# Patient Record
Sex: Male | Born: 1985
Health system: Southern US, Community
[De-identification: ages and names within clinical notes are randomized; demographics above are authoritative.]

---

## 2011-08-21 ENCOUNTER — Emergency Department (INDEPENDENT_AMBULATORY_CARE_PROVIDER_SITE_OTHER)
Admission: EM | Admit: 2011-08-21 | Discharge: 2011-08-21 | Disposition: A | Discharge status: Home / Self Care | Payer: Self-pay | Source: Home / Self Care | Attending: Emergency Medicine | Admitting: Emergency Medicine

## 2011-08-21 ENCOUNTER — Encounter (HOSPITAL_COMMUNITY): Payer: Self-pay | Admitting: Emergency Medicine

## 2011-08-21 DIAGNOSIS — J020 Streptococcal pharyngitis: Secondary | ICD-10-CM

## 2011-08-21 MED ORDER — AZITHROMYCIN 250 MG PO TABS: 250.0000 mg | ORAL_TABLET | Freq: Every day | ORAL | Status: DC

## 2011-08-21 MED ORDER — ACETAMINOPHEN-CODEINE #3 300-30 MG PO TABS: 1.0000 | ORAL_TABLET | Freq: Four times a day (QID) | ORAL | Status: DC | PRN

## 2011-08-21 MED ORDER — AZITHROMYCIN 250 MG PO TABS: 250.0000 mg | ORAL_TABLET | Freq: Every day | ORAL | Status: AC

## 2011-08-21 MED ORDER — ACETAMINOPHEN-CODEINE #3 300-30 MG PO TABS: 1.0000 | ORAL_TABLET | Freq: Four times a day (QID) | ORAL | Status: AC | PRN

## 2011-08-21 NOTE — ED Provider Notes (Signed)
History     CSN: [id number]  Arrival date & time 08/21/11  0806   First MD Initiated Contact with Patient 08/21/11 [phone]      Chief Complaint  Patient presents with  . Sore Throat    (Consider location/radiation/quality/duration/timing/severity/associated sxs/prior treatment) HPI Comments: Patient presents urgent care today complaining of moderate to severe sore throat, exacerbated by swallowing and some discomfort home both areas of his neck. Uncertain if he has had any fevers but has had some body aches and chills. Patient denies any cough or upper congestion has been also drinking teas taken some over-the-counter medicines for symptoms. No improvement.  Patient denies any shortness of breath, wheezing, and is able to swallow liquids and solids.  Patient is a 26 y.o. male presenting with pharyngitis. The history is provided by the patient.  Sore Throat This is a new problem. The current episode started 2 days ago. The problem occurs constantly. The problem has been gradually worsening. Pertinent negatives include no chest pain, no abdominal pain and no shortness of breath. The symptoms are aggravated by swallowing. The symptoms are relieved by NSAIDs. He has tried acetaminophen for the symptoms. The treatment provided no relief.    History reviewed. No pertinent past medical history.  History reviewed. No pertinent past surgical history.  No family history on file.  History  Substance Use Topics  . Smoking status: Current Everyday Smoker  . Smokeless tobacco: Not on file  . Alcohol Use: Yes      Review of Systems  Constitutional: Positive for chills and appetite change. Negative for diaphoresis and unexpected weight change.  HENT: Positive for sore throat and neck pain. Negative for congestion, rhinorrhea, mouth sores, trouble swallowing, neck stiffness, dental problem, voice change and sinus pressure.   Respiratory: Negative for cough, shortness of breath and wheezing.     Cardiovascular: Negative for chest pain.  Gastrointestinal: Negative for abdominal pain.  Skin: Negative for color change, rash and wound.    Allergies  Penicillins  Home Medications   Current Outpatient Rx  Name Route Sig Dispense Refill  . ACETAMINOPHEN-CODEINE #3 300-30 MG PO TABS Oral Take 1-2 tablets by mouth every 6 (six) hours as needed for pain. 15 tablet 0  . AZITHROMYCIN 250 MG PO TABS Oral Take 1 tablet (250 mg total) by mouth daily. Take first 2 tablets together, then 1 every day until finished. 6 tablet 0    BP 113/78  Pulse 79  Temp(Src) 99 F (37.2 C) (Oral)  Resp 18  SpO2 99%  Physical Exam  Nursing note and vitals reviewed. Constitutional: He appears well-developed and well-nourished. No distress.  HENT:  Head: Normocephalic.  Right Ear: Tympanic membrane normal.  Left Ear: Tympanic membrane normal.  Mouth/Throat: Uvula is midline. Posterior oropharyngeal erythema present. No oropharyngeal exudate.  Eyes: Conjunctivae are normal. Right eye exhibits no discharge. Left eye exhibits no discharge.  Neck: Neck supple. No JVD present.  Pulmonary/Chest: Effort normal. No respiratory distress. He has decreased breath sounds. He has no wheezes.  Abdominal: Soft.  Lymphadenopathy:    He has no cervical adenopathy.  Skin: No erythema.    ED Course  Procedures (including critical care time)  Labs Reviewed  POCT RAPID STREP A ([facility] ONLY) - Abnormal; Notable for the following:    Streptococcus, Group A Screen (Direct) POSITIVE (*)    All other components within normal limits   No results found.   1. Strep pharyngitis       MDM  Streptococcal pharyngitis, uncomplicated.        Jimmie Molly, MD 08/21/11 [phone]

## 2011-08-21 NOTE — ED Notes (Signed)
Pt here with sore throat,pain with swallowing and bilat neck discomfort x 2 dys.denies fever,n,v,d.temp 99.0.pt has been drinking teas and pain meds

## 2011-08-21 NOTE — Discharge Instructions (Signed)
Pharyngitis, Viral and Bacterial Pharyngitis is soreness (inflammation) or infection of the pharynx. It is also called a sore throat. CAUSES  Most sore throats are caused by viruses and are part of a cold. However, some sore throats are caused by strep and other bacteria. Sore throats can also be caused by post nasal drip from draining sinuses, allergies and sometimes from sleeping with an open mouth. Infectious sore throats can be spread from person to person by coughing, sneezing and sharing cups or eating utensils. TREATMENT  Sore throats that are viral usually last 3-4 days. Viral illness will get better without medications (antibiotics). Strep throat and other bacterial infections will usually begin to get better about 24-48 hours after you begin to take antibiotics. HOME CARE INSTRUCTIONS   If the caregiver feels there is a bacterial infection or if there is a positive strep test, they will prescribe an antibiotic. The full course of antibiotics must be taken. If the full course of antibiotic is not taken, you or your child may become ill again. If you or your child has strep throat and do not finish all of the medication, serious heart or kidney diseases may develop.   Drink enough water and fluids to keep your urine clear or pale yellow.   Only take over-the-counter or prescription medicines for pain, discomfort or fever as directed by your caregiver.   Get lots of rest.   Gargle with salt water ( tsp. of salt in a glass of water) as often as every 1-2 hours as you need for comfort.   Hard candies may soothe the throat if individual is not at risk for choking. Throat sprays or lozenges may also be used.  SEEK MEDICAL CARE IF:   Large, tender lumps in the neck develop.   A rash develops.   Green, yellow-brown or bloody sputum is coughed up.   Your baby is older than 3 months with a rectal temperature of 100.5 F (38.1 C) or higher for more than 1 day.  SEEK IMMEDIATE MEDICAL CARE  IF:   A stiff neck develops.   You or your child are drooling or unable to swallow liquids.   You or your child are vomiting, unable to keep medications or liquids down.   You or your child has severe pain, unrelieved with recommended medications.   You or your child are having difficulty breathing (not due to stuffy nose).   You or your child are unable to fully open your mouth.   You or your child develop redness, swelling, or severe pain anywhere on the neck.   You have a fever.   Your baby is older than 3 months with a rectal temperature of 102 F (38.9 C) or higher.   Your baby is 3 months old or younger with a rectal temperature of 100.4 F (38 C) or higher.  MAKE SURE YOU:   Understand these instructions.   Will watch your condition.   Will get help right away if you are not doing well or get worse.  Document Released: 04/15/2005 Document Revised: 04/04/2011 Document Reviewed: 07/13/2007 Exit[employer] Patient Information 2012 [employer].Strep Throat Strep throat is an infection of the throat caused by a bacteria named Streptococcus pyogenes. Your caregiver may call the infection streptococcal "tonsillitis" or "pharyngitis" depending on whether there are signs of inflammation in the tonsils or back of the throat. Strep throat is most common in children from 5 to 15 years old during the cold months of the year, but   it can occur in people of any age during any season. This infection is spread from person to person (contagious) through coughing, sneezing, or other close contact. SYMPTOMS   Fever or chills.   Painful, swollen, red tonsils or throat.   Pain or difficulty when swallowing.   White or yellow spots on the tonsils or throat.   Swollen, tender lymph nodes or "glands" of the neck or under the jaw.   Red rash all over the body (rare).  DIAGNOSIS  Many different infections can cause the same symptoms. A test must be done to confirm the diagnosis so the right  treatment can be given. A "rapid strep test" can help your caregiver make the diagnosis in a few minutes. If this test is not available, a light swab of the infected area can be used for a throat culture test. If a throat culture test is done, results are usually available in a day or two. TREATMENT  Strep throat is treated with antibiotic medicine. HOME CARE INSTRUCTIONS   Gargle with 1 tsp of salt in 1 cup of warm water, 3 to 4 times per day or as needed for comfort.   Family members who also have a sore throat or fever should be tested for strep throat and treated with antibiotics if they have the strep infection.   Make sure everyone in your household washes their hands well.   Do not share food, drinking cups, or personal items that could cause the infection to spread to others.   You may need to eat a soft food diet until your sore throat gets better.   Drink enough water and fluids to keep your urine clear or pale yellow. This will help prevent dehydration.   Get plenty of rest.   Stay home from school, daycare, or work until you have been on antibiotics for 24 hours.   Only take over-the-counter or prescription medicines for pain, discomfort, or fever as directed by your caregiver.   If antibiotics are prescribed, take them as directed. Finish them even if you start to feel better.  SEEK MEDICAL CARE IF:   The glands in your neck continue to enlarge.   You develop a rash, cough, or earache.   You cough up green, yellow-brown, or bloody sputum.   You have pain or discomfort not controlled by medicines.   Your problems seem to be getting worse rather than better.  SEEK IMMEDIATE MEDICAL CARE IF:   You develop any new symptoms such as vomiting, severe headache, stiff or painful neck, chest pain, shortness of breath, or trouble swallowing.   You develop severe throat pain, drooling, or changes in your voice.   You develop swelling of the neck, or the skin on the neck  becomes red and tender.   You have a fever.   You develop signs of dehydration, such as fatigue, dry mouth, and decreased urination.   You become increasingly sleepy, or you cannot wake up completely.  Document Released: 04/12/2000 Document Revised: 04/04/2011 Document Reviewed: 06/14/2010 [employer] Patient Information 2012 [employer]. 

## 2013-03-31 ENCOUNTER — Emergency Department (INDEPENDENT_AMBULATORY_CARE_PROVIDER_SITE_OTHER)
Admission: EM | Admit: 2013-03-31 | Discharge: 2013-03-31 | Disposition: A | Discharge status: Home / Self Care | Payer: Self-pay | Source: Home / Self Care | Attending: Family Medicine | Admitting: Family Medicine

## 2013-03-31 ENCOUNTER — Encounter (HOSPITAL_COMMUNITY): Payer: Self-pay | Admitting: Emergency Medicine

## 2013-03-31 DIAGNOSIS — M6740 Ganglion, unspecified site: Secondary | ICD-10-CM

## 2013-03-31 DIAGNOSIS — M67431 Ganglion, right wrist: Secondary | ICD-10-CM

## 2013-03-31 MED ORDER — METHYLPREDNISOLONE ACETATE 40 MG/ML IJ SUSP
INTRAMUSCULAR | Status: AC
  Filled 2013-03-31: qty 5

## 2013-03-31 MED ORDER — METHYLPREDNISOLONE ACETATE 40 MG/ML IJ SUSP
40.0000 mg | Freq: Once | INTRAMUSCULAR | Status: AC
  Administered 2013-03-31: 40 mg via INTRA_ARTICULAR

## 2013-03-31 NOTE — ED Provider Notes (Signed)
CSN: [id number]     Arrival date & time 03/31/13  0930 History   First MD Initiated Contact with Patient 03/31/13 [phone]     Chief Complaint  Patient presents with  . Wrist Pain    HPI 27 yo male who presents for evaluation of right wrist pain. Noticed a small knot on top of wrist one and half months ago. He did not have any associated pain at the time. Yesterday, started having more discomfort around the wrist area, associated with some swelling. He works in [employer], [employer], agricultural. Pain is worst with movement and better with rest. He has not used any ice or medicine.   History reviewed. No pertinent past medical history. History reviewed. No pertinent past surgical history. No family history on file. History  Substance Use Topics  . Smoking status: Current Every Day Smoker  . Smokeless tobacco: Not on file  . Alcohol Use: Yes    Review of Systems Negative except per HPI Allergies  Penicillins  Home Medications  No current outpatient prescriptions on file. BP 130/71  Pulse 94  Temp(Src) 97.5 F (36.4 C) (Oral)  Resp 20  SpO2 96% Physical Exam General: no acute distress, well appearing Wrist: ganglion cyst on dorsal aspect of wrist, tenderness associated with it. Negative Fienkelstein's. Normal range of motion. Some discomfort with resisted ulnar deviation and resisted flexion.   ED Course  Procedures (including critical care time) Procedure: ganglion cyst drainage and steroid injection Consent obtained. Risks and benefits were explained. More notably risk of discoloration of skin from steroid injection was explained. Patient expressed understanding and agreed with injection.  Time Out taken Medication: 8mg  depomedrol (0.2cc of depomedrol 40mg /58ml), lidocaine 1% without epi Preparation: area cleansed with betadine 2cc of lidocaine 1% injected on superficial aspect of the ganglion cyst.  18gauge needle inserted to cyst  at 45 degree angle without any aspirated fluid. Cyst decompressed with needle insertion.  0.86mls of depomedrol 40mg /ml were diluted with 0.5cc of lidocaine This was injected deep into the ganglion, avoiding injection in superficial skin.  Patient tolerated well without bleeding or paresthesias  Patient had good range of motion of joint after injection   MDM   1. Ganglion cyst of wrist, right    Ganglion cyst: gave patient option between observation, oral steroids, ganglion aspiration and steroid injection and referral to surgery. He opted for aspiration. Risks of hypopigmentation from steroid were explained and he agreed to procedure.  Ganglion was decompressed and reinjected with steroid. Explained to patient that there is a 50% chance for it to return, at which point he can follow up with hand surgery for evaluation for surgical removal.   Marena Chancy, PGY-3 Family Medicine Resident     Lonia Skinner, MD 03/31/13 1233

## 2013-03-31 NOTE — ED Notes (Signed)
Pt  Reports  He  Has      A  Cyst  In   r  Wrist   That    He  Has  Had   dor  About  6   Weeks   Worse  Over the  Last  Several  Days  denys  Any injury

## 2013-04-01 NOTE — ED Provider Notes (Signed)
Medical screening examination/treatment/procedure(s) were performed by a resident physician or non-physician practitioner and as the supervising physician I was immediately available for consultation/collaboration.  Allysen Lazo, MD    Jacquline Terrill S Malvern Kadlec, MD 04/01/13 0837 

## 2013-08-29 ENCOUNTER — Emergency Department (HOSPITAL_COMMUNITY): Payer: No Typology Code available for payment source

## 2013-08-29 ENCOUNTER — Encounter (HOSPITAL_COMMUNITY): Payer: Self-pay | Admitting: Emergency Medicine

## 2013-08-29 ENCOUNTER — Emergency Department (HOSPITAL_COMMUNITY)
Admission: EM | Admit: 2013-08-29 | Discharge: 2013-08-29 | Disposition: A | Discharge status: Home / Self Care | Payer: No Typology Code available for payment source | Attending: Emergency Medicine | Admitting: Emergency Medicine

## 2013-08-29 DIAGNOSIS — Z23 Encounter for immunization: Secondary | ICD-10-CM | POA: Insufficient documentation

## 2013-08-29 DIAGNOSIS — IMO0002 Reserved for concepts with insufficient information to code with codable children: Secondary | ICD-10-CM | POA: Insufficient documentation

## 2013-08-29 DIAGNOSIS — S5010XA Contusion of unspecified forearm, initial encounter: Secondary | ICD-10-CM | POA: Insufficient documentation

## 2013-08-29 DIAGNOSIS — S1093XA Contusion of unspecified part of neck, initial encounter: Secondary | ICD-10-CM

## 2013-08-29 DIAGNOSIS — S0100XA Unspecified open wound of scalp, initial encounter: Secondary | ICD-10-CM | POA: Diagnosis not present

## 2013-08-29 DIAGNOSIS — S02401A Maxillary fracture, unspecified, initial encounter for closed fracture: Secondary | ICD-10-CM

## 2013-08-29 DIAGNOSIS — S0003XA Contusion of scalp, initial encounter: Secondary | ICD-10-CM | POA: Insufficient documentation

## 2013-08-29 DIAGNOSIS — S01409A Unspecified open wound of unspecified cheek and temporomandibular area, initial encounter: Secondary | ICD-10-CM | POA: Diagnosis not present

## 2013-08-29 DIAGNOSIS — S0230XA Fracture of orbital floor, unspecified side, initial encounter for closed fracture: Secondary | ICD-10-CM | POA: Diagnosis not present

## 2013-08-29 DIAGNOSIS — S0180XA Unspecified open wound of other part of head, initial encounter: Secondary | ICD-10-CM | POA: Diagnosis not present

## 2013-08-29 DIAGNOSIS — S0993XA Unspecified injury of face, initial encounter: Secondary | ICD-10-CM | POA: Diagnosis present

## 2013-08-29 DIAGNOSIS — S02109A Fracture of base of skull, unspecified side, initial encounter for closed fracture: Secondary | ICD-10-CM | POA: Insufficient documentation

## 2013-08-29 DIAGNOSIS — Z880 Allergy status to penicillin: Secondary | ICD-10-CM | POA: Diagnosis not present

## 2013-08-29 DIAGNOSIS — S0083XA Contusion of other part of head, initial encounter: Secondary | ICD-10-CM | POA: Insufficient documentation

## 2013-08-29 DIAGNOSIS — T07XXXA Unspecified multiple injuries, initial encounter: Secondary | ICD-10-CM

## 2013-08-29 DIAGNOSIS — S5011XA Contusion of right forearm, initial encounter: Secondary | ICD-10-CM

## 2013-08-29 DIAGNOSIS — S0232XA Fracture of orbital floor, left side, initial encounter for closed fracture: Secondary | ICD-10-CM

## 2013-08-29 MED ORDER — AZITHROMYCIN 250 MG PO TABS: 250.0000 mg | ORAL_TABLET | Freq: Once | ORAL | Status: DC

## 2013-08-29 MED ORDER — OXYCODONE-ACETAMINOPHEN 5-325 MG PO TABS: 2.0000 | ORAL_TABLET | ORAL | Status: DC | PRN

## 2013-08-29 MED ORDER — ONDANSETRON 8 MG PO TBDP: 8.0000 mg | ORAL_TABLET | Freq: Three times a day (TID) | ORAL | Status: DC | PRN

## 2013-08-29 MED ORDER — TETANUS-DIPHTH-ACELL PERTUSSIS 5-2.5-18.5 LF-MCG/0.5 IM SUSP
0.5000 mL | Freq: Once | INTRAMUSCULAR | Status: AC
  Administered 2013-08-29: 0.5 mL via INTRAMUSCULAR
  Filled 2013-08-29: qty 0.5

## 2013-08-29 MED ORDER — OXYCODONE-ACETAMINOPHEN 5-325 MG PO TABS
2.0000 | ORAL_TABLET | Freq: Once | ORAL | Status: AC
  Administered 2013-08-29: 2 via ORAL
  Filled 2013-08-29: qty 2

## 2013-08-29 NOTE — ED Notes (Signed)
Face, head, neck, hands, arms cleansed.  Bleeding from most areas ceased.  Left cheek swollen, left eye already shows ecchymosis/swelling.  [employer] into room to speak to pt.
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
## 2013-09-02 ENCOUNTER — Emergency Department (HOSPITAL_COMMUNITY)
Admission: EM | Admit: 2013-09-02 | Discharge: 2013-09-02 | Disposition: A | Discharge status: Home / Self Care | Payer: No Typology Code available for payment source | Attending: Emergency Medicine | Admitting: Emergency Medicine

## 2013-09-02 ENCOUNTER — Encounter (HOSPITAL_COMMUNITY): Payer: Self-pay | Admitting: Emergency Medicine

## 2013-09-02 DIAGNOSIS — Z4802 Encounter for removal of sutures: Secondary | ICD-10-CM | POA: Insufficient documentation

## 2013-09-02 DIAGNOSIS — F17200 Nicotine dependence, unspecified, uncomplicated: Secondary | ICD-10-CM | POA: Diagnosis not present

## 2013-09-02 DIAGNOSIS — Z880 Allergy status to penicillin: Secondary | ICD-10-CM | POA: Diagnosis not present
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
## 2013-09-02 NOTE — Discharge Instructions (Signed)
Suture Removal, Care After Refer to this sheet in the next few weeks. These instructions provide you with information on caring for yourself after your procedure. Your health care provider may also give you more specific instructions. Your treatment has been planned according to current medical practices, but problems sometimes occur. Call your health care provider if you have any problems or questions after your procedure. WHAT TO EXPECT AFTER THE PROCEDURE After your stitches (sutures) are removed, it is typical to have the following:  Some discomfort and swelling in the wound area.  Slight redness in the area. HOME CARE INSTRUCTIONS   If you have skin adhesive strips over the wound area, do not take the strips off. They will fall off on their own in a few days. If the strips remain in place after 14 days, you may remove them.  Change any bandages (dressings) at least once a day or as directed by your health care provider. If the bandage sticks, soak it off with warm, soapy water.  Apply cream or ointment only as directed by your health care provider. If using cream or ointment, wash the area with soap and water 2 times a day to remove all the cream or ointment. Rinse off the soap and pat the area dry with a clean towel.  Keep the wound area dry and clean. If the bandage becomes wet or dirty, or if it develops a bad smell, change it as soon as possible.  Continue to protect the wound from injury.  Use sunscreen when out in the sun. New scars become sunburned easily. SEEK MEDICAL CARE IF:  You have increasing redness, swelling, or pain in the wound.  You see pus coming from the wound.  You have a fever.  You notice a bad smell coming from the wound or dressing.  Your wound breaks open (edges not staying together). Document Released: 01/08/2001 Document Revised: 02/03/2013 Document Reviewed: 11/25/2012 Exit[employer] Patient Information 2014 [employer].  

## 2013-09-02 NOTE — ED Provider Notes (Signed)
Medical screening examination/treatment/procedure(s) were performed by non-physician practitioner and as supervising physician I was immediately available for consultation/collaboration.   EKG Interpretation None       Dyshawn Cangelosi M Isham Smitherman, MD 09/02/13 0621 

## 2013-09-08 ENCOUNTER — Emergency Department (HOSPITAL_COMMUNITY)
Admission: EM | Admit: 2013-09-08 | Discharge: 2013-09-08 | Disposition: A | Discharge status: Home / Self Care | Payer: No Typology Code available for payment source | Attending: Emergency Medicine | Admitting: Emergency Medicine

## 2013-09-08 ENCOUNTER — Encounter (HOSPITAL_COMMUNITY): Payer: Self-pay | Admitting: Emergency Medicine

## 2013-09-08 DIAGNOSIS — Z4802 Encounter for removal of sutures: Secondary | ICD-10-CM | POA: Insufficient documentation

## 2013-09-08 DIAGNOSIS — F17200 Nicotine dependence, unspecified, uncomplicated: Secondary | ICD-10-CM | POA: Insufficient documentation

## 2013-09-08 DIAGNOSIS — Z880 Allergy status to penicillin: Secondary | ICD-10-CM | POA: Diagnosis not present

## 2013-09-08 NOTE — ED Notes (Signed)
Staple removal from head

## 2013-09-08 NOTE — ED Provider Notes (Signed)
CSN: [id number]     Arrival date & time 09/08/13  0944 History   First MD Initiated Contact with Patient 09/08/13 [phone]     Chief Complaint  Patient presents with  . Suture / Staple Removal     (Consider location/radiation/quality/duration/timing/severity/associated sxs/prior Treatment) HPI Comments: Patient presents for suture removal. The laceration is located on his left scalp. The patient reports subjective healing of the laceration and denies any complications or concerns. The patient denies any pain, erythema, tenderness, drainage of the site. Denies fever, NVD, abdominal pain, numbness/tingling, skin color changes.    Patient is a 28 y.o. male presenting with suture removal. The history is provided by the patient. No language interpreter was used.  Suture / Staple Removal This is a new problem. The current episode started 1 to 4 weeks ago. The problem occurs constantly. The problem has been unchanged. Pertinent negatives include no abdominal pain, arthralgias, chest pain, chills, fatigue, fever, nausea, neck pain, vomiting or weakness. Nothing aggravates the symptoms. He has tried nothing for the symptoms. The treatment provided significant relief.    History reviewed. No pertinent past medical history. History reviewed. No pertinent past surgical history. History reviewed. No pertinent family history. History  Substance Use Topics  . Smoking status: Current Every Day Smoker  . Smokeless tobacco: Not on file  . Alcohol Use: Yes    Review of Systems  Constitutional: Negative for fever, chills and fatigue.  HENT: Negative for trouble swallowing.   Eyes: Negative for visual disturbance.  Respiratory: Negative for shortness of breath.   Cardiovascular: Negative for chest pain and palpitations.  Gastrointestinal: Negative for nausea, vomiting, abdominal pain and diarrhea.  Genitourinary: Negative for dysuria and difficulty urinating.  Musculoskeletal: Negative for arthralgias and  neck pain.  Skin: Positive for wound. Negative for color change.  Neurological: Negative for dizziness and weakness.  Psychiatric/Behavioral: Negative for dysphoric mood.      Allergies  Penicillins  Home Medications   Prior to Admission medications   Medication Sig Start Date End Date Taking? Authorizing Provider  Multiple Vitamin (MULTIVITAMIN WITH MINERALS) TABS tablet Take 1 tablet by mouth daily.   Yes Historical Provider, MD  oxyCODONE-acetaminophen (PERCOCET/ROXICET) 5-325 MG per tablet Take 2 tablets by mouth every 4 (four) hours as needed for severe pain. 08/29/13  Yes Olivia Mackielga M Otter, MD  azithromycin (ZITHROMAX Z-PAK) 250 MG tablet Take 1 tablet (250 mg total) by mouth once. Take two tablets today, then one tablet a day for the following 4 days 08/29/13   Olivia Mackielga M Otter, MD  ondansetron (ZOFRAN ODT) 8 MG disintegrating tablet Take 1 tablet (8 mg total) by mouth every 8 (eight) hours as needed for nausea or vomiting. 08/29/13   Olivia Mackielga M Otter, MD   BP 106/67  Pulse 71  Temp(Src) 97.6 F (36.4 C) (Oral)  Resp 16  Ht 5\' 11"  (1.803 m)  Wt 140 lb (63.504 kg)  BMI 19.53 kg/m2  SpO2 100% Physical Exam  Nursing note and vitals reviewed. Constitutional: He is oriented to person, place, and time. He appears well-developed and well-nourished. No distress.  HENT:  Head: Normocephalic and atraumatic.  Left temporal scalp laceration with 3 staples intact and no signs of infection.   Eyes: Conjunctivae are normal.  Neck: Normal range of motion.  Cardiovascular: Normal rate and regular rhythm.  Exam reveals no gallop and no friction rub.   No murmur heard. Pulmonary/Chest: Effort normal and breath sounds normal. He has no wheezes. He has no rales. He  exhibits no tenderness.  Musculoskeletal: Normal range of motion.  Neurological: He is alert and oriented to person, place, and time. Coordination normal.  Speech is goal-oriented. Moves limbs without ataxia.   Skin: Skin is warm and dry.   Psychiatric: He has a normal mood and affect. His behavior is normal.    ED Course  Procedures (including critical care time)  SUTURE REMOVAL Performed by: Emilia BeckKaitlyn Navid Lenzen  Consent: Verbal consent obtained. Consent given by: patient Required items: required blood products, implants, devices, and special equipment available Time out: Immediately prior to procedure a "time out" was called to verify the correct patient, procedure, equipment, support staff and site/side marked as required.  Location: left temporal scalp  Wound Appearance: clean  Sutures/Staples Removed: 3  Patient tolerance: Patient tolerated the procedure well with no immediate complications.     Labs Review Labs Reviewed - No data to display  Imaging Review No results found.   EKG Interpretation None      MDM   Final diagnoses:  Encounter for staple removal    11:03 AM Staples removed without difficulty. No signs of infection.    Emilia BeckKaitlyn Jirah Rider, PA-C 09/08/13 [address]1106  Elanda Garmany, [place] 09/08/13 1107

## 2013-09-14 NOTE — ED Provider Notes (Signed)
Medical screening examination/treatment/procedure(s) were performed by non-physician practitioner and as supervising physician I was immediately available for consultation/collaboration.   EKG Interpretation None        Kamareon Sciandra, MD 09/14/13 1742 
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
## 2017-05-16 ENCOUNTER — Encounter (HOSPITAL_COMMUNITY): Payer: Self-pay | Admitting: Emergency Medicine

## 2017-05-16 ENCOUNTER — Emergency Department (HOSPITAL_COMMUNITY): Payer: Self-pay

## 2017-05-16 ENCOUNTER — Emergency Department (HOSPITAL_COMMUNITY)
Admission: EM | Admit: 2017-05-16 | Discharge: 2017-05-16 | Disposition: A | Discharge status: Home / Self Care | Payer: Self-pay | Attending: Emergency Medicine | Admitting: Emergency Medicine

## 2017-05-16 DIAGNOSIS — J069 Acute upper respiratory infection, unspecified: Secondary | ICD-10-CM

## 2017-05-16 DIAGNOSIS — J189 Pneumonia, unspecified organism: Secondary | ICD-10-CM

## 2017-05-16 DIAGNOSIS — F17200 Nicotine dependence, unspecified, uncomplicated: Secondary | ICD-10-CM | POA: Insufficient documentation

## 2017-05-16 DIAGNOSIS — Z720 Tobacco use: Secondary | ICD-10-CM

## 2017-05-16 DIAGNOSIS — J1100 Influenza due to unidentified influenza virus with unspecified type of pneumonia: Secondary | ICD-10-CM | POA: Insufficient documentation

## 2017-05-16 DIAGNOSIS — J029 Acute pharyngitis, unspecified: Secondary | ICD-10-CM

## 2017-05-16 DIAGNOSIS — R6889 Other general symptoms and signs: Secondary | ICD-10-CM

## 2017-05-16 MED ORDER — DOXYCYCLINE HYCLATE 100 MG PO CAPS: 100.0000 mg | ORAL_CAPSULE | Freq: Two times a day (BID) | ORAL | 0 refills | Status: DC

## 2017-05-16 NOTE — ED Notes (Signed)
Bed: WTR8 Expected date:  Expected time:  Means of arrival:  Comments: 

## 2017-05-16 NOTE — ED Notes (Signed)
Patient transported to X-ray 

## 2017-05-16 NOTE — ED Triage Notes (Signed)
Patient reports sore throat yesterday that has cleared up today but is still having nasal drainage. Pt reports fevers at home but did not take anything for that. Pt took mucinex this am.

## 2017-05-16 NOTE — ED Provider Notes (Signed)
[facility]-EMERGENCY DEPT Provider Note   CSN: [id number] Arrival date & time: 05/16/17  1034     History   Chief Complaint Chief Complaint  Patient presents with  . Sore Throat  . Nasal Congestion    HPI Michael Carey is a 32 y.o. male who presents to the ED with complaints of URI symptoms x 2-3 days.  Symptoms include chills, fever with Tmax 102.5, sore throat, cough with yellow sputum production, nasal congestion, rhinorrhea, and body aches.  He has been using Mucinex multisymptom and cough drops which have helped his symptoms, and his symptoms seem to worsen at night but have no specific known aggravating factors.  He did not receive a flu shot, no known sick contacts.  He is a smoker.  He denies any drooling, trismus, ear pain or drainage, chest pain, shortness of breath, wheezing, abdominal pain, n/v/d/c, dysuria, hematuria, arthralgias, numbness, tingling, focal weakness, rashes, or any other complaints at this time.   The history is provided by the patient and medical records. No language interpreter was used.  Sore Throat  This is a new problem. The current episode started more than 2 days ago. The problem occurs constantly. The problem has been gradually improving. Pertinent negatives include no chest pain, no abdominal pain and no shortness of breath. Nothing aggravates the symptoms. The symptoms are relieved by medications (mucinex multisymptom and cough drops). Treatments tried: mucinex multisymptom and cough drops. The treatment provided moderate relief.    History reviewed. No pertinent past medical history.  There are no active problems to display for this patient.   History reviewed. No pertinent surgical history.     Home Medications    Prior to Admission medications   Medication Sig Start Date End Date Taking? Authorizing Provider  azithromycin (ZITHROMAX Z-PAK) 250 MG tablet Take 1 tablet (250 mg total) by mouth once. Take two tablets  today, then one tablet a day for the following 4 days Patient not taking: Reported on 05/16/2017 08/29/13   Marisa Severintter, Olga, MD  ondansetron (ZOFRAN ODT) 8 MG disintegrating tablet Take 1 tablet (8 mg total) by mouth every 8 (eight) hours as needed for nausea or vomiting. Patient not taking: Reported on 05/16/2017 08/29/13   Marisa Severintter, Olga, MD  oxyCODONE-acetaminophen (PERCOCET/ROXICET) 5-325 MG per tablet Take 2 tablets by mouth every 4 (four) hours as needed for severe pain. Patient not taking: Reported on 05/16/2017 08/29/13   Marisa Severintter, Olga, MD    Family History No family history on file.  Social History Social History   Tobacco Use  . Smoking status: Current Every Day Smoker  Substance Use Topics  . Alcohol use: Yes  . Drug use: No     Allergies   Penicillins   Review of Systems Review of Systems  Constitutional: Positive for chills and fever.  HENT: Positive for congestion, rhinorrhea and sore throat. Negative for drooling, ear discharge, ear pain and trouble swallowing.   Respiratory: Positive for cough. Negative for shortness of breath and wheezing.   Cardiovascular: Negative for chest pain.  Gastrointestinal: Negative for abdominal pain, constipation, diarrhea, nausea and vomiting.  Genitourinary: Negative for dysuria and hematuria.  Musculoskeletal: Positive for myalgias. Negative for arthralgias.  Skin: Negative for rash.  Allergic/Immunologic: Negative for immunocompromised state.  Neurological: Negative for weakness and numbness.  Psychiatric/Behavioral: Negative for confusion.   All other systems reviewed and are negative for acute change except as noted in the HPI.    Physical Exam Updated Vital Signs BP 106/73 (  BP Location: Left Arm)   Pulse 72   Temp 98.9 F (37.2 C) (Oral)   Resp 14   SpO2 96%   Physical Exam  Constitutional: He is oriented to person, place, and time. Vital signs are normal. He appears well-developed and well-nourished.  Non-toxic appearance. No  distress.  Afebrile, nontoxic, NAD  HENT:  Head: Normocephalic and atraumatic.  Nose: Mucosal edema present.  Mouth/Throat: Uvula is midline, oropharynx is clear and moist and mucous membranes are normal. No trismus in the jaw. No uvula swelling. Tonsils are 0 on the right. Tonsils are 0 on the left. No tonsillar exudate.  Nose mildly congested. Oropharynx clear and moist, without uvular swelling or deviation, no trismus or drooling, no tonsillar swelling or erythema, no exudates. No PTA  Eyes: Conjunctivae and EOM are normal. Right eye exhibits no discharge. Left eye exhibits no discharge.  Neck: Normal range of motion. Neck supple.  Cardiovascular: Normal rate, regular rhythm, normal heart sounds and intact distal pulses. Exam reveals no gallop and no friction rub.  No murmur heard. Pulmonary/Chest: Effort normal. No respiratory distress. He has decreased breath sounds. He has no wheezes. He has no rhonchi. He has no rales.  Perhaps slightly diminished lung sounds (?poor inspiratory effort), but otherwise CTAB in all lung fields, no w/r/r, no hypoxia or increased WOB, speaking in full sentences, SpO2 96% on RA   Abdominal: Soft. Normal appearance and bowel sounds are normal. He exhibits no distension. There is no tenderness. There is no rigidity, no rebound, no guarding, no CVA tenderness, no tenderness at McBurney's point and negative Murphy's sign.  Musculoskeletal: Normal range of motion.  Neurological: He is alert and oriented to person, place, and time. He has normal strength. No sensory deficit.  Skin: Skin is warm, dry and intact. No rash noted.  Psychiatric: He has a normal mood and affect.  Nursing note and vitals reviewed.    ED Treatments / Results  Labs (all labs ordered are listed, but only abnormal results are displayed) Labs Reviewed - No data to display  EKG  EKG Interpretation None       Radiology Dg Chest 2 View  Result Date: 05/16/2017 CLINICAL DATA:  Cough  and fever for several days. EXAM: CHEST  2 VIEW COMPARISON:  None. FINDINGS: The cardiomediastinal silhouette is unremarkable. Patchy opacities within the right upper lobe and left lower lobe are suspicious for pneumonia. There is no evidence of pulmonary edema, suspicious pulmonary nodule/mass, pleural effusion, or pneumothorax. No acute bony abnormalities are identified. IMPRESSION: 1. Patchy right upper lobe and left lower lobe opacities suspicious for pneumonia. Radiographic follow-up to resolution recommended following appropriate therapy. Electronically Signed   By: Harmon Pier M.D.   On: 05/16/2017 13:13    Procedures Procedures (including critical care time)  Medications Ordered in ED Medications - No data to display   Initial Impression / Assessment and Plan / ED Course  I have reviewed the triage vital signs and the nursing notes.  Pertinent labs & imaging results that were available during my care of the patient were reviewed by me and considered in my medical decision making (see chart for details).     32 y.o. male here with URI symptoms x2-3 days. On exam, clear throat, mild nasal congestion, lungs without definite wheezing/rhonchi/rales but slightly diminished sounds, afebrile and nontoxic. Given that pt reports fever of 102.5 and he took an antipyretic just PTA, will get CXR to ensure no PNA. Doubt strep, doubt need for  other testing at this time. Could be flu but he's already outside of the window for tamiflu so no use in testing. Pt initially didn't want to wait for CXR but then he is agreeable; doesn't feel it necessary for any other interventions at this time, which is reasonable. Will reassess after CXR.   1:25 PM CXR showing patchy RUL and LLL opacities suspicious for PNA. Will d/c home with abx to cover for this. Advised OTC remedies for symptomatic relief, and f/up with [facility] in 1wk for recheck and to establish medical care. Smoking cessation strongly encouraged. I  explained the diagnosis and have given explicit precautions to return to the ER including for any other new or worsening symptoms. The patient understands and accepts the medical plan as it's been dictated and I have answered their questions. Discharge instructions concerning home care and prescriptions have been given. The patient is STABLE and is discharged to home in good condition.    Final Clinical Impressions(s) / ED Diagnoses   Final diagnoses:  Community acquired pneumonia, unspecified laterality  Upper respiratory tract infection, unspecified type  Sore throat  Flu-like symptoms  Tobacco user    ED Discharge Orders        Ordered    doxycycline (VIBRAMYCIN) 100 MG capsule  2 times daily     05/16/17 [address] Sabino, [place], [place] 05/16/17 1328    Lorre Nick, MD 05/17/17 [phone]

## 2017-05-16 NOTE — Discharge Instructions (Signed)
Continue to stay well-hydrated. Gargle warm salt water and spit it out. Use chloraseptic spray as needed for sore throat. Continue to alternate between Tylenol and Ibuprofen for pain or fever. Use Mucinex for cough suppression/expectoration of mucus. Use netipot and flonase to help with nasal congestion. May consider over-the-counter Benadryl or other antihistamine to decrease secretions and for help with your symptoms. STOP SMOKING! Take antibiotic as directed until completed. [facility]7 days for recheck of ongoing symptoms and to establish medical care. Return to emergency department for emergent changing or worsening of symptoms.

## 2018-02-05 ENCOUNTER — Other Ambulatory Visit: Payer: Self-pay

## 2018-02-05 ENCOUNTER — Encounter (HOSPITAL_BASED_OUTPATIENT_CLINIC_OR_DEPARTMENT_OTHER): Payer: Self-pay | Admitting: *Deleted

## 2018-02-05 ENCOUNTER — Emergency Department (HOSPITAL_BASED_OUTPATIENT_CLINIC_OR_DEPARTMENT_OTHER)
Admission: EM | Admit: 2018-02-05 | Discharge: 2018-02-05 | Disposition: A | Discharge status: Home / Self Care | Payer: Self-pay | Attending: Emergency Medicine | Admitting: Emergency Medicine

## 2018-02-05 DIAGNOSIS — K029 Dental caries, unspecified: Secondary | ICD-10-CM | POA: Insufficient documentation

## 2018-02-05 DIAGNOSIS — F17200 Nicotine dependence, unspecified, uncomplicated: Secondary | ICD-10-CM | POA: Insufficient documentation

## 2018-02-05 MED ORDER — CHLORHEXIDINE GLUCONATE 0.12 % MT SOLN: 15.0000 mL | Freq: Two times a day (BID) | OROMUCOSAL | 0 refills | Status: AC

## 2018-02-05 MED ORDER — CLINDAMYCIN HCL 150 MG PO CAPS: 450.0000 mg | ORAL_CAPSULE | Freq: Three times a day (TID) | ORAL | 0 refills | Status: AC

## 2018-02-05 MED ORDER — BENZOCAINE 10 % MT GEL: 1.0000 "application " | OROMUCOSAL | 0 refills | Status: AC | PRN

## 2018-02-05 MED FILL — ORAJEL: 10 | 7 days supply | Qty: 7 | Fill #0

## 2018-02-05 MED FILL — CHLORHEXIDINE 0.12% RINSE: 0.12 | 16 days supply | Qty: 473 | Fill #0

## 2018-02-05 MED FILL — CLINDAMYCIN HCL 150 MG CAPS: 150 | 7 days supply | Qty: 63 | Fill #0

## 2018-02-05 NOTE — ED Triage Notes (Signed)
2 weeks pt has had multiple toothaches, feels as if the pain is going "to his head now"

## 2018-02-05 NOTE — Discharge Instructions (Addendum)
Please return to the Emergency Department for any new or worsening symptoms or if your symptoms do not improve. Please be sure to follow up with your Primary Care Physician as soon as possible regarding your visit today. If you do not have a Primary Doctor please use the resources below to establish one. Please use the antibiotic medication clindamycin as prescribed.  You may also use the mouthwash Peridex as prescribed.  You may use the Orajel as prescribed.  Do not swallow the Peridex or Orajel. You must follow-up with a dentist as soon as possible for further evaluation and treatment of your dental infection.  Only dentist can fix tooth.  Please use the resources below to find a dentist.  Contact a health care provider if: Your pain is not controlled with medicines. Your symptoms are worse. You have new symptoms. Get help right away if: You are unable to open your mouth. You are having trouble breathing or swallowing. You have a fever. Your face, neck, or jaw is swollen.  Do not take your medicine if  develop an itchy rash, swelling in your mouth or lips, or difficulty breathing.   RESOURCE GUIDE  Chronic Pain Problems: Contact [facility]  [phone] Patients need to be referred by their primary care doctor.  Insufficient Money for Medicine: Contact [employer] Way:  call "211" or [facility] [phone].  No Primary Care Doctor: Call [facility]  [phone] - can help you locate a primary care doctor that  accepts your insurance, provides certain services, etc. Physician Referral Service- [phone]  Agencies that provide inexpensive medical care: [facility]  [phone] [facility]  [phone] [facility]  [phone] [facility]  [phone] [facility]  [phone] [facility]  [phone]  Medicaid-accepting [facility] Providers: [facility]- [address],  [address]  [phone], Mon-Fri 9am-7pm, Sat 9am-1pm [facility]- [address] [place], Suite [place]  [phone] [facility]- [address] [place]  [phone] [facility]- [address]  [phone] Renaye Rakers- [address] [place], [address], [phone]  Only accepts [place] patients after they have their name  applied to their card  Self Pay (no insurance) in [facility]: Sickle Cell Patients: Dr Willey Blade, [facility]  [address] [place], [phone] [facility]- [address] [place]  [phone]       [facility] [place]- [address]       -     [facility]- see information above (Speak to [employer] if you do not have insurance)       -  [facility]- [address] [place], [phone]       -  [facility]- [address],  [phone]       -  [facility]- [address], [phone]       -  Dr Julio Sicks-  [address], [place], [phone]       -  [facility]- [address] [phone]       -  [facility]- [address], [phone], also [address] [phone]       -    [facility]- [address] [place], [phone], 1st &  3rd Saturday   every month, 10am-1pm  1) Find a Doctor and Pay Out of Pocket Although you won't have to find out who is covered by your insurance plan, it is a good idea to ask around and get recommendations. You will then need to call the office and see if the doctor you have chosen will accept you as a new patient and what types of options they offer for patients who are self-pay. Some doctors offer discounts or will set up payment plans for their patients who do not have insurance, but you will need to ask so you aren't surprised when you get to your appointment.  2) Contact Your Local Health Department Not all health departments have  doctors that can see patients for sick visits, but many do, so it is worth a call to see if yours does. If you don't know where your local health department is, you can check in your phone book. The CDC also has a tool to help you locate your state's health department, and many state websites also have listings of all of their local health departments.  3) Find a [facility] If your illness is not likely to be very severe or complicated, you may want to try a walk in clinic. These are popping up all over the country in pharmacies, drugstores, and shopping centers. They're usually staffed by nurse practitioners or physician assistants that have been trained to treat common illnesses and complaints. They're usually fairly quick and inexpensive. However, if you have serious medical issues or chronic medical problems, these are probably not your best option  [facility] [place], [facility], [address] [place], phone [phone] or [phone].  Monday - Friday, call for an appointment. [place], [facility], [address], [place], phone [phone] or [phone].  Monday - Friday, call for an appointment.  Abuse/Neglect: [place] [phone] [place] [phone] (After Hours)  Emergency Shelter:  [facility] [phone]  Maternity Homes: Room at the [place] [phone] [place] [phone]  MRSA Hotline #:   (918)341-9888  [facility] Dept. [address]         [place] Phone:  [phone]                                  Phone:  [phone]                    Phone:  [place], [phone]       -     Larence Penning  [facility] in [place], [address]                                  [place] [phone] or [phone] (After Hours)   [place]  Substance Abuse Resources: Alcohol and Drug Services  [place] [phone] [facility] [phone] Residential & Outpatient Substance Abuse Program  [phone]  Psychological Services: [place]  [phone] [place]  [place], [place] [address], [place], [place]: [phone] or [phone], [employer]uy  Dental Assistance  If unable to pay or uninsured, contact:  [facility] or [facility]. to become qualified for the adult dental clinic.  Patients with Medicaid: [facility] [phone] [address], [place] [address] [phone]  If unable to pay, or uninsured, contact [facility] [phone]) or Xzaria Teo [phone] in [place], [place] in [facility]) to become qualified for the adult dental clinic   Other [place]- [place], [place], [place], [address], [place], [place], 2nd and 4th Thursday of the month at 6:30am.  10 clients each day by appointment, can sometimes see walk-in patients if someone does not show for an appointment. [facility]- [address] [place] [address], [place], [address], [place], [place], [place], [address], [place] Department- [phone] [place] [facility] [phone]

## 2018-02-05 NOTE — ED Provider Notes (Signed)
[facility] EMERGENCY DEPARTMENT Provider Note   CSN: [id number] Arrival date & time: 02/05/18  1312     History   Chief Complaint Chief Complaint  Patient presents with  . Dental Pain    HPI Michael Carey is a 32 y.o. male presenting for 1 week of upper left-sided dental pain.  Patient describes his pain as a throbbing constant moderate in intensity pain that is worsened with chewing on that side.  Patient states that his pain occasionally radiates up to his left ear.  Patient states that he has been using ibuprofen for his pain with moderate relief.  Patient denies fever, trouble swallowing, trouble speaking, facial swelling, neck swelling, tooth drainage or gum swelling.  Patient states that he is feeling well aside from his dental pain.  Patient is requesting dental resources today.  HPI  History reviewed. No pertinent past medical history.  There are no active problems to display for this patient.   History reviewed. No pertinent surgical history.      Home Medications    Prior to Admission medications   Medication Sig Start Date End Date Taking? Authorizing Provider  benzocaine (ORAJEL) 10 % mucosal gel Use as directed 1 application in the mouth or throat as needed for mouth pain. Do not swallow 02/05/18   Harlene Salts A, PA-C  chlorhexidine (PERIDEX) 0.12 % solution Use as directed 15 mLs in the mouth or throat 2 (two) times daily. Do not swallow 02/05/18   Harlene Salts A, PA-C  clindamycin (CLEOCIN) 150 MG capsule Take 3 capsules (450 mg total) by mouth 3 (three) times daily for 7 days. 02/05/18 02/12/18  Bill Salinas, PA-C    Family History History reviewed. No pertinent family history.  Social History Social History   Tobacco Use  . Smoking status: Current Every Day Smoker  . Smokeless tobacco: Never Used  Substance Use Topics  . Alcohol use: Yes  . Drug use: No     Allergies   Penicillins   Review of Systems Review of  Systems  Constitutional: Negative.  Negative for chills and fever.  HENT: Positive for dental problem and ear pain. Negative for drooling, facial swelling, rhinorrhea, sore throat, trouble swallowing and voice change.   Gastrointestinal: Negative.  Negative for nausea and vomiting.  Musculoskeletal: Negative.  Negative for neck pain and neck stiffness.   Physical Exam Updated Vital Signs BP 121/84 (BP Location: Right Arm)   Pulse 62   Temp 98.4 F (36.9 C) (Oral)   Resp 16   Ht 5\' 11"  (1.803 m)   Wt 63.5 kg   SpO2 100%   BMI 19.53 kg/m   Physical Exam  Constitutional: He appears well-developed and well-nourished. No distress.  HENT:  Head: Normocephalic and atraumatic.  Right Ear: Hearing, tympanic membrane, external ear and ear canal normal.  Left Ear: Hearing, tympanic membrane, external ear and ear canal normal.  Nose: Nose normal.  Mouth/Throat: Uvula is midline, oropharynx is clear and moist and mucous membranes are normal. No trismus in the jaw. Dental caries present. No dental abscesses or uvula swelling. No oropharyngeal exudate, posterior oropharyngeal edema, posterior oropharyngeal erythema or tonsillar abscesses.    Patient with multiple dental caries present.  The largest of which is left upper side as indicated.  There is no gingival erythema, swelling or drainage present.  No facial swelling, no tongue protrusion, no drooling.  Extraocular movements intact bilaterally without pain.  Eyes: Pupils are equal, round, and reactive to light. EOM  are normal.  Neck: Trachea normal, normal range of motion, full passive range of motion without pain and phonation normal. Neck supple. No tracheal tenderness present. No tracheal deviation present.  Pulmonary/Chest: Effort normal. No respiratory distress.  Abdominal: Soft. There is no tenderness. There is no rebound and no guarding.  Musculoskeletal: Normal range of motion.  Neurological: He is alert. GCS eye subscore is 4. GCS  verbal subscore is 5. GCS motor subscore is 6.  Speech is clear and goal oriented, follows commands Major Cranial nerves without deficit, no facial droop Moves extremities without ataxia, coordination intact Normal gait  Skin: Skin is warm and dry.  Psychiatric: He has a normal mood and affect. His behavior is normal.   ED Treatments / Results  Labs (all labs ordered are listed, but only abnormal results are displayed) Labs Reviewed - No data to display  EKG None  Radiology No results found.  Procedures Procedures (including critical care time)  Medications Ordered in ED Medications - No data to display   Initial Impression / Assessment and Plan / ED Course  I have reviewed the triage vital signs and the nursing notes.  Pertinent labs & imaging results that were available during my care of the patient were reviewed by me and considered in my medical decision making (see chart for details).    Patient with dental pain. Infected dental surface cavity noted, without signs or symptoms of dental abscess, no swelling/erythema/tenderness of the gums.  Patient is well-appearing, afebrile, nontoxic, speaking well.  Patient able to swallow without pain.  No signs of swelling or concern for Ludwig's angina/Peritonsilar abscess/Retropharyngeal abscess or other deep tissue infections.  No sign of swelling of the neck, patient has good range of motion of the neck, no trismus. No facial swelling, no signs of preseptal or orbital cellulitis. Patient with penicillin allergy, will be treated with clindamycin 450 mg 3 times daily for the next 7 days. Patient has also been given prescriptions for Peridex and Orajel.  Patient informed to follow-up with a dentist as soon as possible for further evaluation and treatment of his cavities.  Patient given dental resources.  Patient afebrile, not tachycardic, not hypotensive, well-appearing in no acute distress.  Patient informed that he may also use  ibuprofen as directed the packaging for pain.  At this time there does not appear to be any evidence of an acute emergency medical condition and the patient appears stable for discharge with appropriate outpatient follow up. Diagnosis was discussed with patient who verbalizes understanding of care plan and is agreeable to discharge. I have discussed return precautions with patient who verbalizes understanding of return precautions. Patient strongly encouraged to follow-up with their PCP. All questions answered.   Note: Portions of this report may have been transcribed using voice recognition software. Every effort was made to ensure accuracy; however, inadvertent computerized transcription errors may still be present.  Final Clinical Impressions(s) / ED Diagnoses   Final diagnoses:  Pain due to dental caries    ED Discharge Orders         Ordered    chlorhexidine (PERIDEX) 0.12 % solution  2 times daily     02/05/18 1443    benzocaine (ORAJEL) 10 % mucosal gel  As needed     02/05/18 1443    clindamycin (CLEOCIN) 150 MG capsule  3 times daily     02/05/18 1443           Bill Salinas, PA-C 02/05/18 1457  Terrilee Files, MD 02/05/18 [phone]

## 2018-06-21 ENCOUNTER — Emergency Department (HOSPITAL_COMMUNITY)
Admission: EM | Admit: 2018-06-21 | Discharge: 2018-06-21 | Disposition: A | Discharge status: Home / Self Care | Payer: Self-pay | Attending: Emergency Medicine | Admitting: Emergency Medicine

## 2018-06-21 ENCOUNTER — Encounter (HOSPITAL_COMMUNITY): Payer: Self-pay | Admitting: Emergency Medicine

## 2018-06-21 DIAGNOSIS — R300 Dysuria: Secondary | ICD-10-CM | POA: Insufficient documentation

## 2018-06-21 DIAGNOSIS — Z7251 High risk heterosexual behavior: Secondary | ICD-10-CM | POA: Insufficient documentation

## 2018-06-21 DIAGNOSIS — F17200 Nicotine dependence, unspecified, uncomplicated: Secondary | ICD-10-CM | POA: Insufficient documentation

## 2018-06-21 DIAGNOSIS — R369 Urethral discharge, unspecified: Secondary | ICD-10-CM | POA: Insufficient documentation

## 2018-06-21 MED ORDER — AZITHROMYCIN 250 MG PO TABS
1000.0000 mg | ORAL_TABLET | Freq: Once | ORAL | Status: AC
  Administered 2018-06-21: 1000 mg via ORAL
  Filled 2018-06-21: qty 4

## 2018-06-21 MED ORDER — ONDANSETRON 4 MG PO TBDP
4.0000 mg | ORAL_TABLET | Freq: Once | ORAL | Status: AC
  Administered 2018-06-21: 4 mg via ORAL
  Filled 2018-06-21: qty 1

## 2018-06-21 MED ORDER — CIPROFLOXACIN HCL 500 MG PO TABS
500.0000 mg | ORAL_TABLET | Freq: Once | ORAL | Status: AC
  Administered 2018-06-21: 500 mg via ORAL
  Filled 2018-06-21: qty 1

## 2018-06-21 NOTE — ED Triage Notes (Addendum)
Pt c/o penile discharge and burning for 2-3 days.

## 2018-06-21 NOTE — Discharge Instructions (Addendum)
Follow up with the health department for additional screening.  °

## 2018-06-21 NOTE — ED Provider Notes (Signed)
[facility]-EMERGENCY DEPT Provider Note   CSN: [id number] Arrival date & time: 06/21/18  1635    History   Chief Complaint Chief Complaint  Patient presents with  . Penile Discharge    HPI Michael Carey is a 33 y.o. male who presents to the ED with c/o penile discharge and dysuria that started over the past 2 or 3 days. Patient reports having unprotected sex with an new sex partner prior to symptoms starting. Patient reports he has a friend that has been "talking" to the same girl and has the same symptoms.      HPI  History reviewed. No pertinent past medical history.  There are no active problems to display for this patient.   History reviewed. No pertinent surgical history.      Home Medications    Prior to Admission medications   Medication Sig Start Date End Date Taking? Authorizing Provider  benzocaine (ORAJEL) 10 % mucosal gel Use as directed 1 application in the mouth or throat as needed for mouth pain. Do not swallow 02/05/18   Harlene Salts A, PA-C  chlorhexidine (PERIDEX) 0.12 % solution Use as directed 15 mLs in the mouth or throat 2 (two) times daily. Do not swallow 02/05/18   Bill Salinas, PA-C    Family History No family history on file.  Social History Social History   Tobacco Use  . Smoking status: Current Every Day Smoker  . Smokeless tobacco: Never Used  Substance Use Topics  . Alcohol use: Yes  . Drug use: No     Allergies   Penicillins   Review of Systems Review of Systems  Genitourinary: Positive for discharge and dysuria. Negative for frequency, scrotal swelling and testicular pain.  All other systems reviewed and are negative.    Physical Exam Updated Vital Signs BP 125/88 (BP Location: Left Arm)   Pulse 93   Temp 98.1 F (36.7 C) (Oral)   Resp 16   SpO2 100%   Physical Exam Vitals signs and nursing note reviewed. Exam conducted with a chaperone present.  Constitutional:      General: He  is not in acute distress.    Appearance: He is well-developed.  HENT:     Head: Normocephalic and atraumatic.     Nose: Nose normal.  Eyes:     Extraocular Movements: Extraocular movements intact.     Conjunctiva/sclera: Conjunctivae normal.  Neck:     Musculoskeletal: Neck supple.  Cardiovascular:     Rate and Rhythm: Normal rate.  Pulmonary:     Effort: Pulmonary effort is normal.  Abdominal:     Palpations: Abdomen is soft.     Tenderness: There is no abdominal tenderness.  Genitourinary:    Penis: Circumcised. Discharge present.      Scrotum/Testes: Normal.     Comments: Yellow discharge noted at urethral opening.  Musculoskeletal: Normal range of motion.  Lymphadenopathy:     Lower Body: No right inguinal adenopathy. No left inguinal adenopathy.  Skin:    General: Skin is warm and dry.  Neurological:     Mental Status: He is alert and oriented to person, place, and time.     Cranial Nerves: No cranial nerve deficit.      ED Treatments / Results  Labs (all labs ordered are listed, but only abnormal results are displayed) Labs Reviewed  RPR  HIV ANTIBODY (ROUTINE TESTING W REFLEX)  GC/CHLAMYDIA PROBE AMP ([place]) NOT AT [facility]    Radiology No results  found.  Procedures Procedures (including critical care time)  Medications Ordered in ED Medications  azithromycin (ZITHROMAX) tablet 1,000 mg (1,000 mg Oral Given 06/21/18 1847)  ondansetron (ZOFRAN-ODT) disintegrating tablet 4 mg (4 mg Oral Given 06/21/18 1904)  azithromycin (ZITHROMAX) tablet 1,000 mg (1,000 mg Oral Given 06/21/18 1908)  ciprofloxacin (CIPRO) tablet 500 mg (500 mg Oral Given 06/21/18 1908)     Initial Impression / Assessment and Plan / ED Course  I have reviewed the triage vital signs and the nursing notes. Pt presents with concerns for possible STD.  Pt understands that they have GC/Chlamydia cultures pending and that they will need to inform all sexual partners if results return positive.  Pt has been treated prophylactically with azithromycin 2 grams and Cipro 500 mg due to patient have severe reaction to penicillin. Patient to be discharged with instructions to follow up with [facility]. Discussed importance of using protection when sexually active.   Final Clinical Impressions(s) / ED Diagnoses   Final diagnoses:  Penile discharge  Dysuria  Unprotected sex    ED Discharge Orders    None       Kerrie Buffalo [place], [place] 06/21/18 1911    Mancel Bale, MD 06/21/18 2355

## 2018-06-22 LAB — HIV ANTIBODY (ROUTINE TESTING W REFLEX): HIV Screen 4th Generation wRfx: NONREACTIVE
